# Patient Record
Sex: Male | Born: 1965 | Race: White | Hispanic: No | Marital: Married | Smoking: Never smoker
Health system: Southern US, Community
[De-identification: ages and names within clinical notes are randomized; demographics above are authoritative.]

---

## 2008-05-24 ENCOUNTER — Ambulatory Visit: Payer: Self-pay | Admitting: Family Medicine

## 2010-03-02 ENCOUNTER — Ambulatory Visit: Payer: Self-pay | Admitting: Family Medicine

## 2010-03-02 ENCOUNTER — Encounter: Admission: RE | Admit: 2010-03-02 | Discharge: 2010-03-02 | Payer: Self-pay | Admitting: Family Medicine

## 2010-10-22 ENCOUNTER — Ambulatory Visit (INDEPENDENT_AMBULATORY_CARE_PROVIDER_SITE_OTHER): Payer: Managed Care, Other (non HMO) | Admitting: Family Medicine

## 2010-10-22 DIAGNOSIS — R197 Diarrhea, unspecified: Secondary | ICD-10-CM

## 2012-06-04 ENCOUNTER — Encounter: Payer: Self-pay | Admitting: Family Medicine

## 2012-06-04 ENCOUNTER — Ambulatory Visit (INDEPENDENT_AMBULATORY_CARE_PROVIDER_SITE_OTHER): Payer: Managed Care, Other (non HMO) | Admitting: Family Medicine

## 2012-06-04 VITALS — BP 140/90

## 2012-06-04 DIAGNOSIS — L989 Disorder of the skin and subcutaneous tissue, unspecified: Secondary | ICD-10-CM

## 2012-06-04 NOTE — Progress Notes (Signed)
  Subjective:    Patient ID: Hunter Harper, male    DOB: [birth year], 46 y.o.   MRN: [id number]  HPI He is here for referral back to surgery for removal of the left thigh lesion. He did not have this removed before because he states the surgeon wanted to put him to sleep for this and he was not interested in that.   Review of Systems     Objective:   Physical Exam 2 cm raised fluctuant lesion is noted in the midportion of the left inner thigh area.       Assessment & Plan:   1. Skin lesion of left leg  Ambulatory referral to General Surgery   I will refer him to Gen. surgery again and have him discuss having this done under local anesthesia.

## 2012-06-05 ENCOUNTER — Ambulatory Visit (INDEPENDENT_AMBULATORY_CARE_PROVIDER_SITE_OTHER): Payer: Private Health Insurance - Indemnity | Admitting: General Surgery

## 2012-06-05 ENCOUNTER — Encounter (INDEPENDENT_AMBULATORY_CARE_PROVIDER_SITE_OTHER): Payer: Self-pay | Admitting: General Surgery

## 2012-06-05 VITALS — BP 134/82 | HR 79 | Temp 97.3°F | Ht 75.0 in | Wt 216.4 lb

## 2012-06-05 DIAGNOSIS — R229 Localized swelling, mass and lump, unspecified: Secondary | ICD-10-CM

## 2012-06-05 DIAGNOSIS — R2240 Localized swelling, mass and lump, unspecified lower limb: Secondary | ICD-10-CM

## 2012-06-05 NOTE — Progress Notes (Signed)
Patient ID: Hunter Harper, male   DOB: 06/26/1966, 46 y.o.   MRN: [id number]  Chief Complaint  Patient presents with  . Pre-op Exam    eval cyst inner Lt thigh    HPI Hunter Harper is a 46 y.o. male.  Referred by Dr. Susann Givens her left inner thigh mass. He states that this mass for several years. He states that the mesentery and there over the last several weeks, and  causes irritation.  Procedure the areas that were drained with our look erythematous. HPI  History reviewed. No pertinent past medical history.  History reviewed. No pertinent past surgical history.  Family History  Problem Relation Age of Onset  . Cancer Mother 16    breast    Social History History  Substance Use Topics  . Smoking status: Never Smoker   . Smokeless tobacco: Never Used  . Alcohol Use: Yes    No Known Allergies  No current outpatient prescriptions on file.    Review of Systems Review of Systems  Constitutional: Negative.   All other systems reviewed and are negative.    Blood pressure 134/82, pulse 79, temperature 97.3 F (36.3 C), temperature source Temporal, height 6\' 3"  (1.905 m), weight 216 lb 6.4 oz (98.158 kg), SpO2 96.00%.  Physical Exam Physical Exam  Constitutional: He is oriented to person, place, and time. He appears well-developed and well-nourished.  HENT:  Head: Normocephalic and atraumatic.  Eyes: Conjunctivae normal and EOM are normal. Pupils are equal, round, and reactive to light.  Neck: Normal range of motion. Neck supple.  Cardiovascular: Normal rate, regular rhythm and normal heart sounds.   Pulmonary/Chest: Effort normal and breath sounds normal.  Abdominal: Soft. Bowel sounds are normal.  Genitourinary:     Musculoskeletal: Normal range of motion.  Neurological: He is alert and oriented to person, place, and time.    Data Reviewed none  Assessment    A 46 year old male with a left inner thigh mass versus cyst.    Plan    1. We'll proceed  to the operating room for removal and excision of the mass.  2.All risks and benefits were discussed with the patient, to generally include infection, bleeding, damage to surrounding structures, and recurrence. Alternatives were offered and described.  All questions were answered and the patient voiced understanding of the procedure and wishes to proceed at this point.        Marigene Ehlers., Mattheo Swindle 06/05/2012, 2:52 PM

## 2012-06-22 ENCOUNTER — Other Ambulatory Visit (INDEPENDENT_AMBULATORY_CARE_PROVIDER_SITE_OTHER): Payer: Self-pay | Admitting: General Surgery

## 2012-06-22 DIAGNOSIS — E65 Localized adiposity: Secondary | ICD-10-CM

## 2012-06-25 ENCOUNTER — Telehealth (INDEPENDENT_AMBULATORY_CARE_PROVIDER_SITE_OTHER): Payer: Self-pay | Admitting: General Surgery

## 2012-06-25 NOTE — Telephone Encounter (Addendum)
Pt called stating that his inner thigh incision had started to open up some - 0.5 inch. No other issues. Advised to clean daily and cover with bandage daily. Advised to call office in am to see if can move up postop appt

## 2012-07-17 ENCOUNTER — Ambulatory Visit
Admission: RE | Admit: 2012-07-17 | Discharge: 2012-07-17 | Disposition: A | Discharge status: Home / Self Care | Payer: Managed Care, Other (non HMO) | Source: Ambulatory Visit | Attending: Emergency Medicine | Admitting: Emergency Medicine

## 2012-07-17 ENCOUNTER — Other Ambulatory Visit: Payer: Self-pay | Admitting: Emergency Medicine

## 2012-07-17 ENCOUNTER — Other Ambulatory Visit (HOSPITAL_COMMUNITY): Payer: Managed Care, Other (non HMO)

## 2012-07-17 DIAGNOSIS — R109 Unspecified abdominal pain: Secondary | ICD-10-CM

## 2012-07-21 ENCOUNTER — Encounter (INDEPENDENT_AMBULATORY_CARE_PROVIDER_SITE_OTHER): Payer: Private Health Insurance - Indemnity | Admitting: General Surgery

## 2012-07-24 ENCOUNTER — Ambulatory Visit (INDEPENDENT_AMBULATORY_CARE_PROVIDER_SITE_OTHER): Payer: Private Health Insurance - Indemnity | Admitting: General Surgery

## 2012-07-24 ENCOUNTER — Encounter (INDEPENDENT_AMBULATORY_CARE_PROVIDER_SITE_OTHER): Payer: Self-pay | Admitting: General Surgery

## 2012-07-24 VITALS — BP 128/86 | HR 72 | Temp 96.3°F | Resp 18 | Ht 75.0 in | Wt 221.6 lb

## 2012-07-24 DIAGNOSIS — R2240 Localized swelling, mass and lump, unspecified lower limb: Secondary | ICD-10-CM

## 2012-07-24 DIAGNOSIS — R229 Localized swelling, mass and lump, unspecified: Secondary | ICD-10-CM

## 2012-07-24 NOTE — Progress Notes (Signed)
Patient ID: Hunter Harper, male   DOB: 1966/01/26, 47 y.o.   MRN: [id number] The patient is a 47 year old male status post left thigh mass removal. Patient has been doing well postoperatively after the area had a small amount of dehiscence. There is nearly closed at this time. 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  Exam: Is clean dry and intact the patient has approximately a 1 mm area of dehiscence which is healing well there is no erythema.  Assessment and plan: Patient followup when necessary

## 2013-09-03 IMAGING — CR DG ABDOMEN 1V
2 series · 2 of 2 positions shown · non-contrast
Comparison: None.

CLINICAL DATA: Left-sided flank pain

ABDOMEN - 1 VIEW

[view not recorded (1 of 2)]
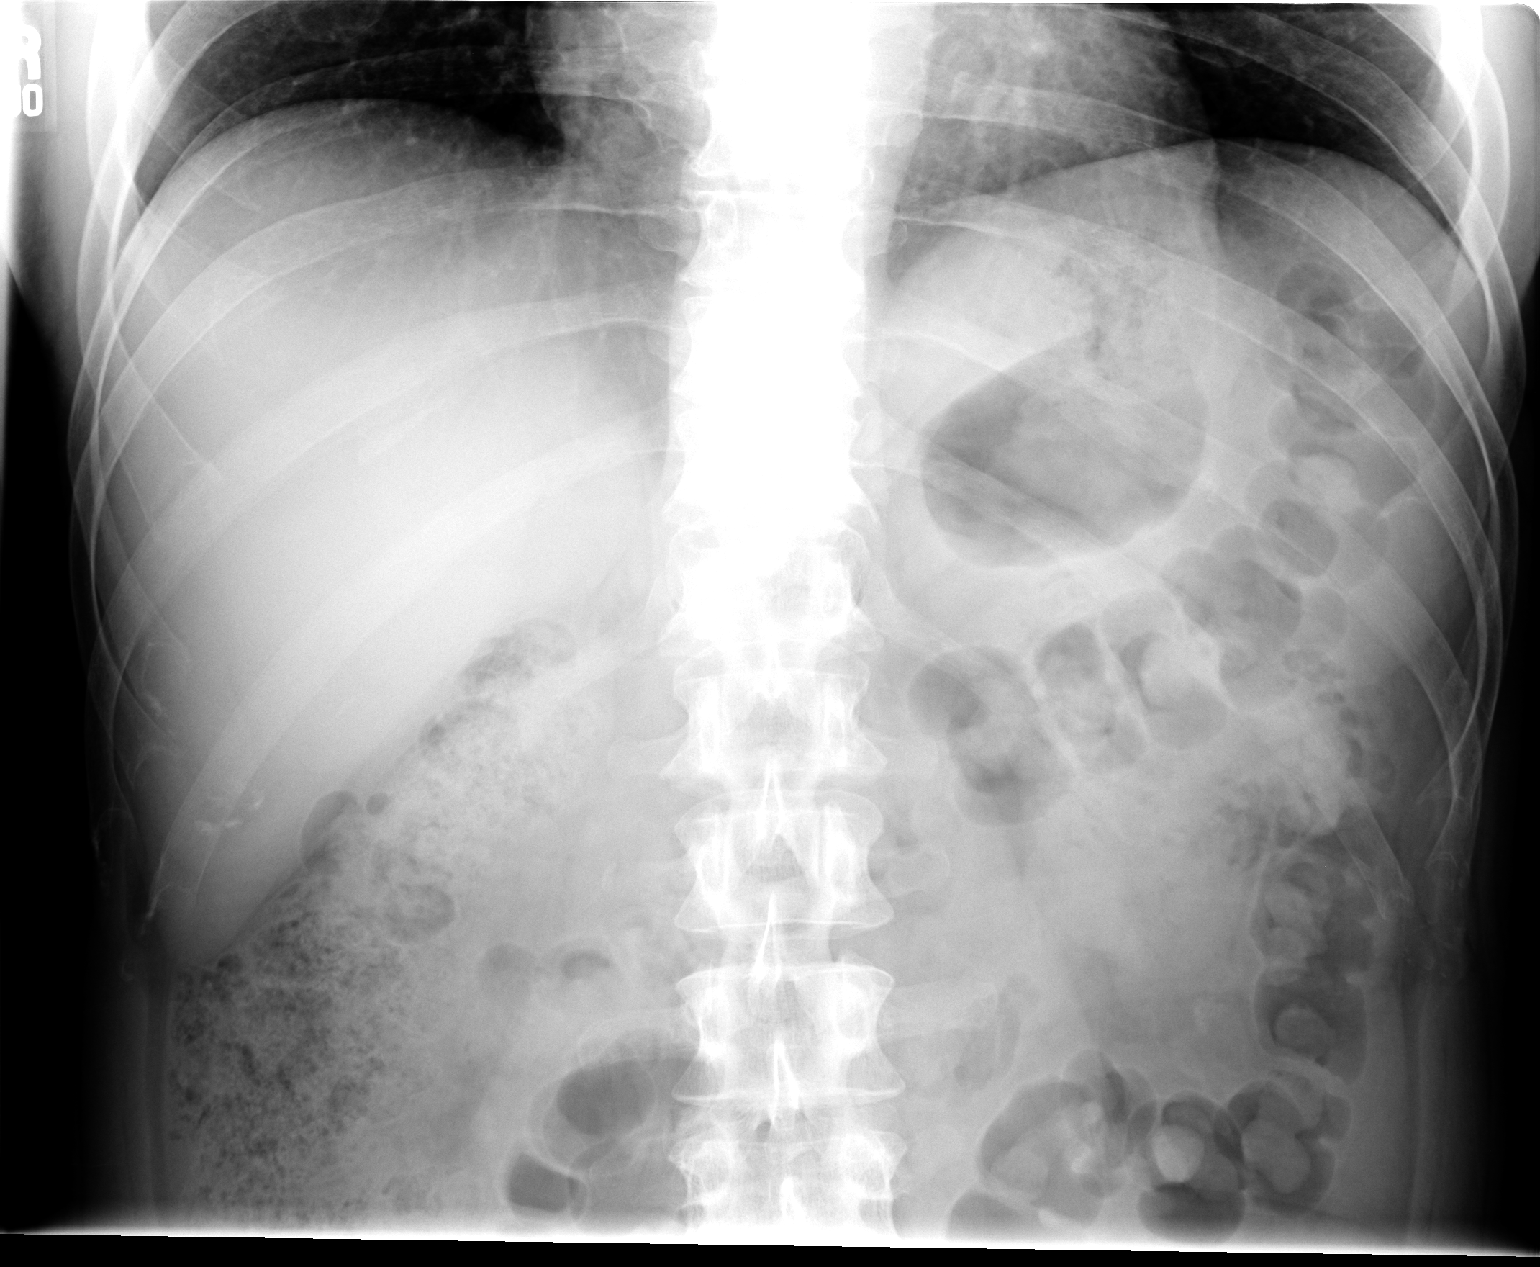

[view not recorded (2 of 2)]
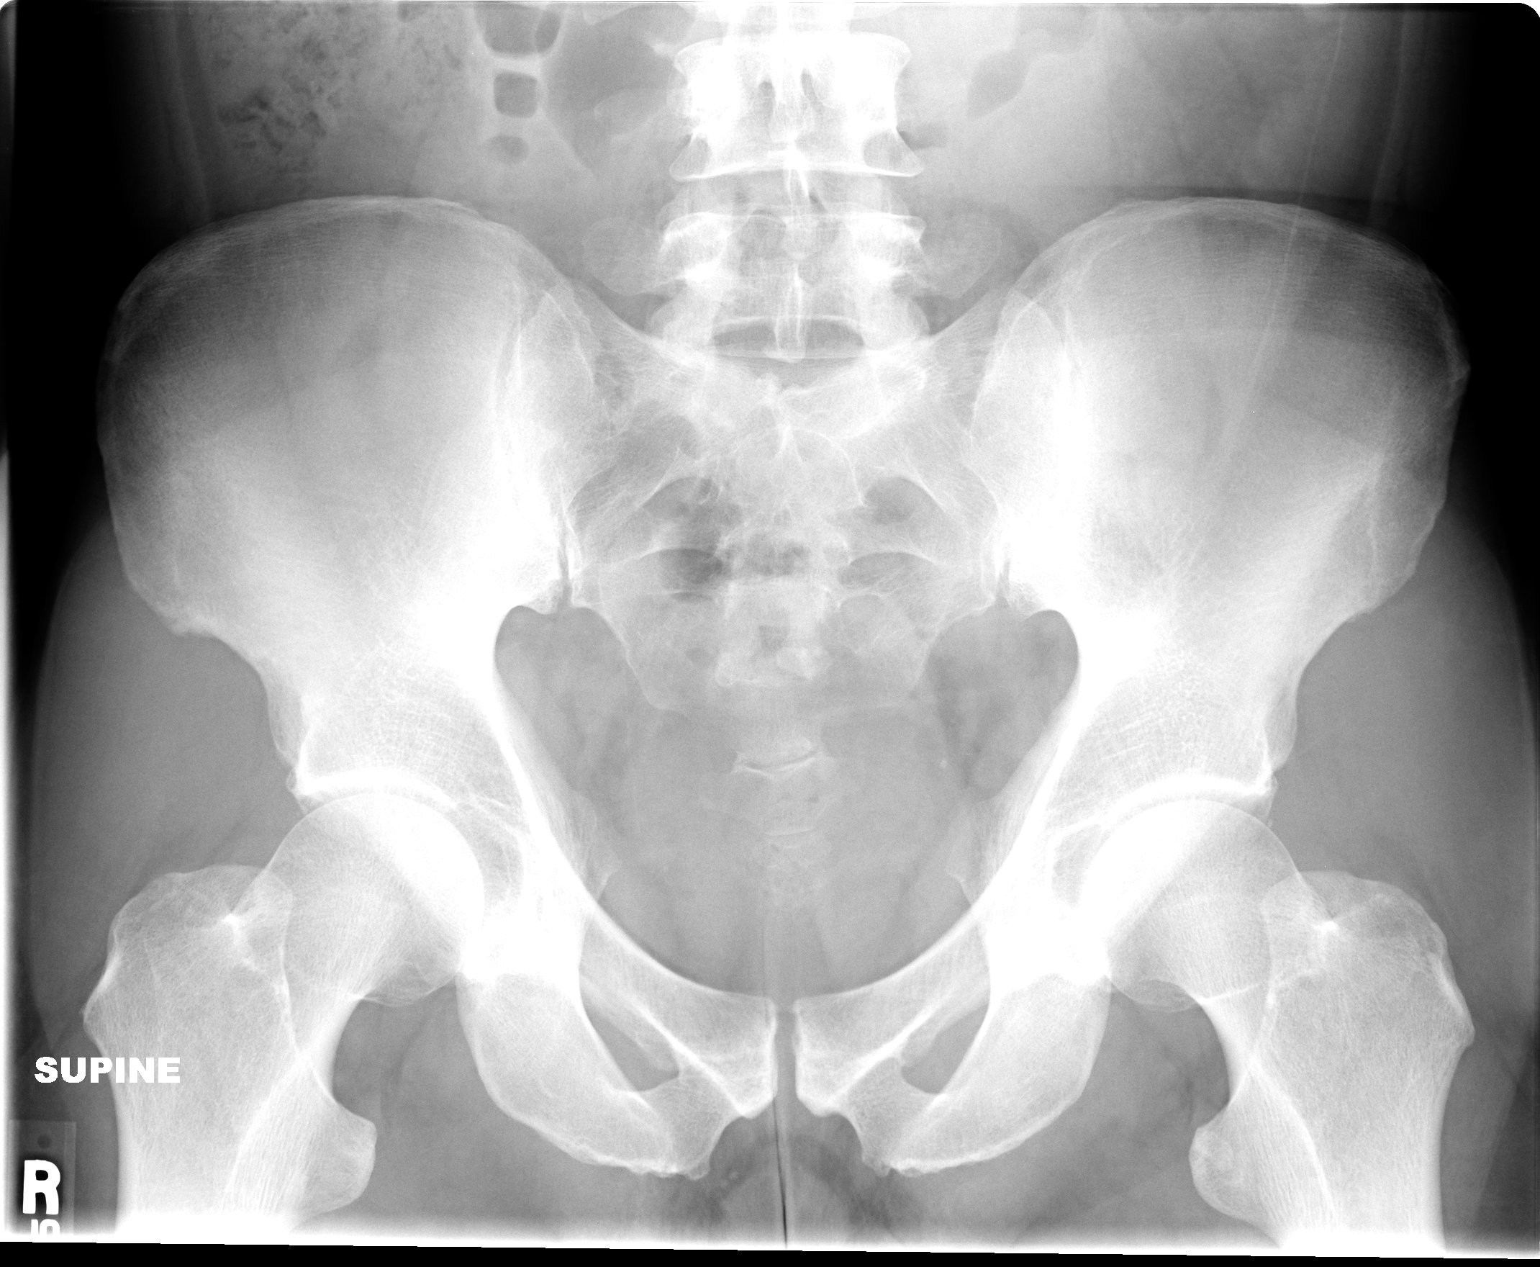

[2 of 2 positions shown; findings below may reference images not displayed]

FINDINGS: There is a nonobstructive bowel gas pattern identified.
No definitive renal calculi are noted although the renal shadows
are obscured by overlying bowel gas.  A tiny 2 mm calcification is
noted in the left hemipelvis in the expected region of the distal
left ureter.  The visualized bony structures are within normal
limits.
IMPRESSION: 2 ml calcification in the left hemipelvis which may represent
distal left ureteral stone.  A CT urogram would be helpful for
further evaluation.

## 2014-10-28 ENCOUNTER — Encounter: Payer: Self-pay | Admitting: Medical

## 2014-10-28 ENCOUNTER — Ambulatory Visit (INDEPENDENT_AMBULATORY_CARE_PROVIDER_SITE_OTHER): Payer: Managed Care, Other (non HMO) | Admitting: Medical

## 2014-10-28 VITALS — BP 140/90 | HR 85 | Temp 97.8°F | Resp 16 | Wt 217.0 lb

## 2014-10-28 DIAGNOSIS — R030 Elevated blood-pressure reading, without diagnosis of hypertension: Secondary | ICD-10-CM

## 2014-10-28 DIAGNOSIS — Z202 Contact with and (suspected) exposure to infections with a predominantly sexual mode of transmission: Secondary | ICD-10-CM

## 2014-10-28 DIAGNOSIS — R300 Dysuria: Secondary | ICD-10-CM

## 2014-10-28 DIAGNOSIS — N452 Orchitis: Secondary | ICD-10-CM

## 2014-10-28 LAB — POCT URINALYSIS DIPSTICK
Bilirubin, UA: NEGATIVE
Blood, UA: NEGATIVE
GLUCOSE UA: NEGATIVE
Ketones, UA: NEGATIVE
LEUKOCYTES UA: NEGATIVE
Nitrite, UA: NEGATIVE
PH UA: 6
Protein, UA: NEGATIVE
SPEC GRAV UA: 1.015
Urobilinogen, UA: NEGATIVE

## 2014-10-28 NOTE — Progress Notes (Signed)
Subjective Here for discomfort in belly and right testicle, slight burning with urination x few days.  No testicular swelling.  No blood in urine, no penile discharge.  No redness, no rash.   No prior similar.   Married, but recently had intercourse with another person, with condoms.  Denies fever, no NVD, no back pain.  He does note hx/o prostate infection years ago.   No prior UTI.   No prior STD.  Last STD testing, unsure. No other aggravating or relieving factors. No other complaint.  ROS as in subjective  Objective: BP 140/90 mmHg  Pulse 85  Temp(Src) 97.8 F (36.6 C) (Oral)  Resp 16  Wt 217 lb (98.431 kg)  General appearance: alert, no distress, WD/WN Abdomen: +bs, soft, non tender, non distended, no masses, no hepatomegaly, no splenomegaly Back:nontender GU: normal male genitalia, uncircumcised, no tenderness, no swelling, no rash, no lymphadenopathy   Assessment: Encounter Diagnoses  Name Primary?  . Orchitis Yes  . Dysuria   . Venereal disease contact   . Elevated blood pressure reading without diagnosis of hypertension     Plan: Urine micro today shows no concerns.  Declines empiric treatment for gonorrhea and chlamydia.  dicussed symptoms, possible differential which may include STD.   advised condom use, safe sex.   Will call with lab results and plan.  Hydrate well, and recheck or call if worse in the meantime.   Will need f/u on elevated BP as discussed.

## 2014-10-29 LAB — GC/CHLAMYDIA PROBE AMP
CT PROBE, AMP APTIMA: NEGATIVE
GC Probe RNA: NEGATIVE

## 2014-10-29 LAB — RPR

## 2014-10-29 LAB — HIV ANTIBODY (ROUTINE TESTING W REFLEX): HIV: NONREACTIVE

## 2014-10-31 ENCOUNTER — Other Ambulatory Visit: Payer: Self-pay | Admitting: Medical

## 2014-10-31 MED ORDER — CIPROFLOXACIN HCL 500 MG PO TABS: 500.0000 mg | ORAL_TABLET | Freq: Two times a day (BID) | ORAL | Status: DC

## 2015-07-27 ENCOUNTER — Encounter: Payer: Self-pay | Admitting: Family Medicine

## 2015-07-27 ENCOUNTER — Ambulatory Visit (INDEPENDENT_AMBULATORY_CARE_PROVIDER_SITE_OTHER): Payer: Managed Care, Other (non HMO) | Admitting: Family Medicine

## 2015-07-27 VITALS — BP 130/90 | HR 81 | Ht 75.0 in | Wt 200.0 lb

## 2015-07-27 DIAGNOSIS — Z202 Contact with and (suspected) exposure to infections with a predominantly sexual mode of transmission: Secondary | ICD-10-CM

## 2015-07-27 DIAGNOSIS — M545 Low back pain: Secondary | ICD-10-CM | POA: Diagnosis not present

## 2015-07-27 NOTE — Progress Notes (Signed)
   Subjective:    Patient ID: Hunter Harper, male    DOB: 02/23/1966, 50 y.o.   MRN: [id number]  HPI He is here for follow-up blood work concerning possible exposure to HIV. His last test was negative and he would like another one repeated to be safe. He also states that in mid November while playing tennis he landed on his left leg with that extended indeed feel a popping sensation in his low back area. It got better over the next week but since then has been intermittently giving him trouble. He does note increased pain with motion especially laterally to the left. No numbness, tingling or weakness.   Review of Systems     Objective:   Physical Exam Alert and in no distress. Exam of his back does show no tenderness palpation with normal lumbar curve and motion. No tenderness over SI or sciatic notch. Pearlean BrownieFaber and stork test was negative. Negative straight leg raising.       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 Recommended conservative care with stretching and heat as well as strengthening exercises. He is comfortable with that.

## 2015-07-28 ENCOUNTER — Encounter: Payer: Self-pay | Admitting: Family Medicine

## 2015-07-28 LAB — HIV ANTIBODY (ROUTINE TESTING W REFLEX): HIV: NONREACTIVE

## 2019-08-22 ENCOUNTER — Ambulatory Visit: Payer: Self-pay | Attending: Internal Medicine

## 2019-08-22 DIAGNOSIS — Z23 Encounter for immunization: Secondary | ICD-10-CM | POA: Insufficient documentation

## 2019-08-22 NOTE — Progress Notes (Signed)
   Covid-19 Vaccination Clinic  Name:  Hunter Harper    MRN: [id number] DOB: 1966/05/15  08/22/2019  [name] was observed post Covid-19 immunization for 15 minutes without incidence. He was provided with Vaccine Information Sheet and instruction to access the V-Safe system.   [name] was instructed to call 911 with any severe reactions post vaccine: Marland Kitchen Difficulty breathing  . Swelling of your face and throat  . A fast heartbeat  . A bad rash all over your body  . Dizziness and weakness    Immunizations Administered    Name Date Dose VIS Date Route   Pfizer COVID-19 Vaccine 08/22/2019 11:41 AM 0.3 mL 06/25/2019 Intramuscular   Manufacturer: [employer], [employer]   Lot: [id number]   NDC: [address]-2

## 2019-09-15 ENCOUNTER — Ambulatory Visit: Payer: Self-pay | Attending: Internal Medicine

## 2019-09-15 DIAGNOSIS — Z23 Encounter for immunization: Secondary | ICD-10-CM | POA: Insufficient documentation

## 2019-09-15 NOTE — Progress Notes (Signed)
   Covid-19 Vaccination Clinic  Name:  Hunter Harper    MRN: [id number] DOB: April 28, 1966  09/15/2019  [name] was observed post Covid-19 immunization for 15 minutes without incident. He was provided with Vaccine Information Sheet and instruction to access the V-Safe system.   [name] was instructed to call 911 with any severe reactions post vaccine: Marland Kitchen Difficulty breathing  . Swelling of face and throat  . A fast heartbeat  . A bad rash all over body  . Dizziness and weakness   Immunizations Administered    Name Date Dose VIS Date Route   Pfizer COVID-19 Vaccine 09/15/2019  8:39 AM 0.3 mL 06/25/2019 Intramuscular   Manufacturer: [employer], [employer]   Lot: [id number]   NDC: [address]-0

## 2021-12-01 ENCOUNTER — Encounter (HOSPITAL_BASED_OUTPATIENT_CLINIC_OR_DEPARTMENT_OTHER): Payer: Self-pay

## 2021-12-01 ENCOUNTER — Other Ambulatory Visit: Payer: Self-pay

## 2021-12-01 ENCOUNTER — Emergency Department (HOSPITAL_BASED_OUTPATIENT_CLINIC_OR_DEPARTMENT_OTHER)
Admission: EM | Admit: 2021-12-01 | Discharge: 2021-12-01 | Disposition: A | Discharge status: Home / Self Care | Payer: Commercial Managed Care - PPO | Attending: Emergency Medicine | Admitting: Emergency Medicine

## 2021-12-01 DIAGNOSIS — Z7982 Long term (current) use of aspirin: Secondary | ICD-10-CM | POA: Diagnosis not present

## 2021-12-01 DIAGNOSIS — B029 Zoster without complications: Secondary | ICD-10-CM | POA: Diagnosis not present

## 2021-12-01 DIAGNOSIS — R21 Rash and other nonspecific skin eruption: Secondary | ICD-10-CM | POA: Diagnosis present

## 2021-12-01 DIAGNOSIS — R519 Headache, unspecified: Secondary | ICD-10-CM | POA: Diagnosis not present

## 2021-12-01 MED ORDER — TETRACAINE HCL 0.5 % OP SOLN
2.0000 [drp] | Freq: Once | OPHTHALMIC | Status: AC
  Administered 2021-12-01: 2 [drp] via OPHTHALMIC
  Filled 2021-12-01: qty 4

## 2021-12-01 MED ORDER — FLUORESCEIN SODIUM 1 MG OP STRP
1.0000 | ORAL_STRIP | Freq: Once | OPHTHALMIC | Status: AC
  Administered 2021-12-01: 1 via OPHTHALMIC
  Filled 2021-12-01: qty 1

## 2021-12-01 MED ORDER — VALACYCLOVIR HCL 1 G PO TABS: 1000.0000 mg | ORAL_TABLET | Freq: Three times a day (TID) | ORAL | 0 refills | Status: AC

## 2021-12-01 NOTE — ED Provider Notes (Signed)
[facility]ERGENC[facility] DEPT Provider Note   CSN: [id number] Arrival date & time: 12/01/21  1320     History  Chief Complaint  Patient presents with   Rash    Hunter Harper is a 56 y.o. male who presents to the emergency department complaining of a rash onset 3 days.  He notes his symptoms began with a headache to the right side 6 days ago.  Denies history of similar symptoms.  Was seen at [facility] and sent a prescription for acyclovir and moxifloxacin drops.  Patient also notes that he has had redness to the right eye with drainage and crusting to the right eye.  Denies sick contacts with similar symptoms.  Denies fever, chills, vision changes, headache.     The history is provided by the patient. No language interpreter was used.      Home Medications Prior to Admission medications   Medication Sig Start Date End Date Taking? Authorizing Provider  aspirin 81 MG tablet Take 81 mg by mouth daily.    [provider]      Allergies    Patient has no known allergies.    Review of Systems   Review of Systems  Constitutional:  Negative for chills and fever.  Eyes:  Negative for visual disturbance.  Skin:  Positive for rash.  Neurological:  Negative for headaches.  All other systems reviewed and are negative.  Physical Exam Updated Vital Signs BP (!) 151/97 (BP Location: Right Arm)   Pulse 91   Temp 98.3 F (36.8 C) (Oral)   Resp 18   Ht 6\' 3"  (1.905 m)   Wt 90.7 kg   SpO2 98%   BMI 24.99 kg/m  Physical Exam Vitals and nursing note reviewed.  Constitutional:      General: He is not in acute distress.    Appearance: Normal appearance. He is not ill-appearing or diaphoretic.  HENT:     Head: Normocephalic and atraumatic.     Nose: Nose normal. No congestion or rhinorrhea.     Comments: No lesions noted to the nose.    Mouth/Throat:     Mouth: Mucous membranes are moist.     Pharynx: Oropharynx is clear. No oropharyngeal exudate  or posterior oropharyngeal erythema.  Eyes:     General: Lids are everted, no foreign bodies appreciated. Vision grossly intact. No visual field deficit or scleral icterus.       Right eye: No foreign body or discharge.        Left eye: No foreign body or discharge.     Extraocular Movements: Extraocular movements intact.     Conjunctiva/sclera: Conjunctivae normal.     Pupils: Pupils are equal, round, and reactive to light.     Right eye: No corneal abrasion or fluorescein uptake.     Comments: Crusting noted to right eyelashes.  Erythema noted to right lateral aspect of the sclera.  PERRL.  EOMI.  No fluorescein uptake.  No dendrites noted on fluorescein exam.  Cardiovascular:     Rate and Rhythm: Normal rate and regular rhythm.     Pulses: Normal pulses.     Heart sounds: Normal heart sounds.  Pulmonary:     Effort: Pulmonary effort is normal. No respiratory distress.     Breath sounds: Normal breath sounds. No wheezing.  Abdominal:     General: Bowel sounds are normal.     Palpations: Abdomen is soft. There is no mass.     Tenderness: There  is no abdominal tenderness. There is no guarding or rebound.  Musculoskeletal:        General: Normal range of motion.     Cervical back: Normal range of motion and neck supple.  Skin:    General: Skin is warm and dry.     Findings: No rash.  Neurological:     Mental Status: He is alert.  Psychiatric:        Behavior: Behavior normal.       ED Results / Procedures / Treatments   Labs (all labs ordered are listed, but only abnormal results are displayed) Labs Reviewed - No data to display  EKG None  Radiology No results found.  Procedures Procedures    Medications Ordered in ED Medications  tetracaine (PONTOCAINE) 0.5 % ophthalmic solution 2 drop (2 drops Right Eye Given 12/01/21 1500)  fluorescein ophthalmic strip 1 strip (1 strip Right Eye Given 12/01/21 1500)    ED Course/ Medical Decision Making/ A&P Clinical Course  as of 12/02/21 0007  Sat Dec 01, 2021  1547 Consult with Dr. Katy Fitch who recommends follow-up in the outpatient setting this week.  Also recommends additional 5-day dose of Valtrex.   [SB]    Clinical Course User Index [SB] Seddrick Flax A, PA-C                           Medical Decision Making Risk Prescription drug management.   Pt presents with rash onset 3 days.  [facility] was [facility] and diagnosed with shingles and given prescription of Valtrex and moxifloxacin drops to treat presumed conjunctivitis of the right eye.  Patient notes that he wears glasses and denies contact use.  Patient does not have an ophthalmologist at this time.  Vital signs stable, patient afebrile, not tachycardic or hypoxic.  On exam patient with crusting noted to right upper eyelashes.  Erythema noted to the right lateral aspect of the sclera.  PERRL.  EOMI.  No fluorescein uptake.  No dendrites noted on fluorescein exam.  No acute cardiovascular or respiratory exam findings.  Differential diagnosis includes shingles, herpes zoster ophthalmicus, contact dermatitis, conjunctivitis.    Additional history obtained:  Additional history obtained from Spouse/Significant Other External records from outside source obtained and reviewed including: [facility] was evaluated [facility] 11/[facility]/2021 for similar symptoms.  At that time sent a prescription for Valtrex x5 days and moxifloxacin drops. He was not given information for an ophthalmologist at the time.   Consultations: I requested consultation with the ophthalmologist, Dr. Katy Fitch, and discussed lab and imaging findings as well as pertinent plan - they recommend: Follow-up in the outpatient setting this week  Disposition: Presentation suspicious for shingles.  Doubt contact dermatitis at this time.  Doubt conjunctivitis at this time.  After consideration of the diagnostic results and the patients response to treatment, I feel that the patient  would benefit from Discharge home.  Discussed with patient's importance of maintaining follow-up appointment with ophthalmologist.  We will send an additional 5-day dose of Valtrex to patient's pharmacy.  Supportive care measures and strict return precautions discussed with patient at bedside. Pt acknowledges and verbalizes understanding. Pt appears safe for discharge. Follow up as indicated in discharge paperwork.    This chart was dictated using voice recognition software, Dragon. Despite the best efforts of this provider to proofread and correct errors, errors may still occur which can change documentation meaning.  Final Clinical Impression(s) /  ED Diagnoses Final diagnoses:  Herpes zoster without complication    Rx / DC Orders ED Discharge Orders          Ordered    valACYclovir (VALTREX) 1000 MG tablet  3 times daily        12/01/21 1553              Camdon Saetern A, PA-C 12/02/21 0013    Lucrezia Starch, MD 12/02/21 1016

## 2021-12-01 NOTE — Discharge Instructions (Signed)
It was a pleasure taking care of you today!  You may continue taking the prescription Valtrex as prescribed. You have been sent an additional 5 day prescription of valtrex to take after completion of your initial 5 day prescription. You may use over the counter artificial tears to the affected eye.  Attached is information for the ophthalmologist, Dr. Delton Prairie, call on 12/03/2021 to set up a follow-up appointment regarding today's ED visit.  Return to the emergency department if you are experiencing increasing/worsening vision changes, headache, mental status change, worsening symptoms.

## 2021-12-01 NOTE — ED Triage Notes (Signed)
Patient here POV from Home with Rash.  Endorses Shingles Rash to Right Face affecting Right Eye. No discernable Change in Vision.  No Fevers. Seen in UC on Thursday and given Valtrex as well as Eyedrops.   NAD Noted during Triage. A&Ox4. GCS 15. Ambulatory.
